# Patient Record
Sex: Female | Born: 1995 | Race: Black or African American | Hispanic: No | Marital: Single | Smoking: Never smoker
Health system: Southern US, Community
[De-identification: ages and names within clinical notes are randomized; demographics above are authoritative.]

## PROBLEM LIST (undated history)

## (undated) ENCOUNTER — Inpatient Hospital Stay (HOSPITAL_COMMUNITY): Payer: Self-pay

## (undated) DIAGNOSIS — G43909 Migraine, unspecified, not intractable, without status migrainosus: Secondary | ICD-10-CM

## (undated) HISTORY — PX: NO PAST SURGERIES: SHX2092

---
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
## 2017-02-15 ENCOUNTER — Emergency Department (HOSPITAL_COMMUNITY): Payer: BC Managed Care – PPO

## 2017-02-15 ENCOUNTER — Encounter (HOSPITAL_COMMUNITY): Payer: Self-pay | Admitting: Emergency Medicine

## 2017-02-15 ENCOUNTER — Emergency Department (HOSPITAL_COMMUNITY)
Admission: EM | Admit: 2017-02-15 | Discharge: 2017-02-15 | Disposition: A | Discharge status: Home / Self Care | Payer: BC Managed Care – PPO | Attending: Emergency Medicine | Admitting: Emergency Medicine

## 2017-02-15 DIAGNOSIS — R1030 Lower abdominal pain, unspecified: Secondary | ICD-10-CM

## 2017-02-15 DIAGNOSIS — Z113 Encounter for screening for infections with a predominantly sexual mode of transmission: Secondary | ICD-10-CM

## 2017-02-15 DIAGNOSIS — N898 Other specified noninflammatory disorders of vagina: Secondary | ICD-10-CM | POA: Insufficient documentation

## 2017-02-15 DIAGNOSIS — R102 Pelvic and perineal pain: Secondary | ICD-10-CM | POA: Diagnosis not present

## 2017-02-15 DIAGNOSIS — Z202 Contact with and (suspected) exposure to infections with a predominantly sexual mode of transmission: Secondary | ICD-10-CM | POA: Diagnosis not present

## 2017-02-15 LAB — URINALYSIS, ROUTINE W REFLEX MICROSCOPIC
BILIRUBIN URINE: NEGATIVE
Glucose, UA: NEGATIVE mg/dL
Hgb urine dipstick: NEGATIVE
Ketones, ur: 5 mg/dL — AB
Nitrite: NEGATIVE
PH: 7 (ref 5.0–8.0)
Protein, ur: NEGATIVE mg/dL
Specific Gravity, Urine: 1.015 (ref 1.005–1.030)

## 2017-02-15 LAB — POC URINE PREG, ED: Preg Test, Ur: NEGATIVE

## 2017-02-15 LAB — WET PREP, GENITAL
Clue Cells Wet Prep HPF POC: NONE SEEN
SPERM: NONE SEEN
TRICH WET PREP: NONE SEEN
YEAST WET PREP: NONE SEEN

## 2017-02-15 MED ORDER — CEFTRIAXONE SODIUM 250 MG IJ SOLR
250.0000 mg | Freq: Once | INTRAMUSCULAR | Status: AC
  Administered 2017-02-15: 250 mg via INTRAMUSCULAR
  Filled 2017-02-15: qty 250

## 2017-02-15 MED ORDER — ACETAMINOPHEN 325 MG PO TABS
650.0000 mg | ORAL_TABLET | Freq: Once | ORAL | Status: AC
  Administered 2017-02-15: 650 mg via ORAL
  Filled 2017-02-15: qty 2

## 2017-02-15 MED ORDER — SODIUM CHLORIDE 0.9 % IJ SOLN
INTRAMUSCULAR | Status: AC
  Filled 2017-02-15: qty 10

## 2017-02-15 MED ORDER — AZITHROMYCIN 250 MG PO TABS
1000.0000 mg | ORAL_TABLET | Freq: Once | ORAL | Status: AC
  Administered 2017-02-15: 1000 mg via ORAL
  Filled 2017-02-15: qty 4

## 2017-02-15 NOTE — Discharge Instructions (Signed)
You were tested for gonorrhea, chlamydia, HIV and syphilis today. The results will come back in 3 days. You will receive a phone call if they're positive. If Gonorrhea or Chlamydia are positive, you do not need further treatment, but you should contact your sexual partners so they can get treatment. If HIV or syphilis are positive, you need to follow up with the health department for treatment.  If the results are negative, you will not receive a phone call.  [facility]  Return to the ER if you develop fevers, chills, persistent vomiting, or any new or worsening symptoms

## 2017-02-15 NOTE — ED Triage Notes (Signed)
Pt states she has had recent irregular periods. Pt states she has had abnormal pelvic pain unrelated to menstrual cramps. Pt states she has had vaginal discharge and pain in pelvis with ambulation. Pt denies pain with urination.

## 2017-02-15 NOTE — ED Provider Notes (Signed)
[facility]-EMERGENCY DEPT Provider Note   CSN: [id number] Arrival date & time: 02/15/17  1056     History   Chief Complaint Chief Complaint  Patient presents with  . Pelvic Pain  . Vaginal Discharge    HPI Marissa Brennan is a 22 y.o. female presenting with 2 days lower abdominal pain.  Patient states that for the past several months, she's had very irregular cycles. She does not believe she had a period over the summer, but this month, had 2 periods. The first was light and only for a few days, the recent period was heavier. She used to be on depo shots, but last shot was in November 2017. She is not currently on any birth control. She reports associated vaginal discharge, beginning recently. She is sexually active with one female partner. They do not of protection. She is unsure if she is pregnant. She denies fever, chills, chest pain, shortness breath, nausea, vomiting, urinary symptoms, or abnormal bowel movements. The lower abdominal pain is described as cramping of the right lower quadrant/R side pelvis, worse when she moves. She has not tried anything to relieve her pain. She denies h/o of abd problems, but states that she was the the hospital in May and told that she has inflammation of her cervix, and she should not use tampons. Pt has had a normal appetite, and no increased pain with oral intake. No pain with urination or BMs.   HPI  Past Medical History:  Diagnosis Date  . Migraines     There are no active problems to display for this patient.   History reviewed. No pertinent surgical history.  OB History    No data available       Home Medications    Prior to Admission medications   Medication Sig Start Date End Date Taking? Authorizing Provider  ibuprofen (ADVIL,MOTRIN) 200 MG tablet Take 400 mg by mouth every 6 (six) hours as needed for moderate pain.   Yes [provider]    Family History History reviewed. No pertinent family history.  Social  History Social History  Substance Use Topics  . Smoking status: Never Smoker  . Smokeless tobacco: Never Used  . Alcohol use No     Allergies   Patient has no known allergies.   Review of Systems Review of Systems  Constitutional: Negative for chills and fever.  Respiratory: Negative for cough, chest tightness and shortness of breath.   Cardiovascular: Negative for chest pain.  Gastrointestinal: Positive for abdominal pain. Negative for abdominal distention, constipation, diarrhea, nausea and vomiting.  Genitourinary: Positive for menstrual problem, vaginal bleeding and vaginal discharge. Negative for dysuria, frequency and hematuria.     Physical Exam Updated Vital Signs BP 112/70 (BP Location: Left Arm)   Pulse 78   Temp 98.5 F (36.9 C) (Oral)   Resp 18   LMP 02/07/2017   SpO2 100%   Physical Exam  Constitutional: She is oriented to person, place, and time. She appears well-developed and well-nourished. No distress.  HENT:  Head: Normocephalic and atraumatic.  Eyes: EOM are normal.  Neck: Normal range of motion.  Cardiovascular: Normal rate, regular rhythm and intact distal pulses.   Pulmonary/Chest: Effort normal and breath sounds normal. No respiratory distress. She has no wheezes.  Abdominal: Soft. Bowel sounds are normal. She exhibits no distension and no mass. There is no tenderness. There is no guarding.  Genitourinary: Rectum normal, vagina normal and uterus normal. Pelvic exam was performed with patient supine. There is  no rash, tenderness or lesion on the right labia. There is no rash, tenderness or lesion on the left labia. Cervix exhibits discharge. Cervix exhibits no motion tenderness and no friability. Right adnexum displays no mass and no tenderness. Left adnexum displays no mass and no tenderness.  Genitourinary Comments: Chaperone present. Thick yellow discharge from the cervix. No cervical motion tenderness.  Musculoskeletal: Normal range of motion.   Neurological: She is alert and oriented to person, place, and time.  Skin: Skin is warm. No rash noted.  Psychiatric: She has a normal mood and affect.  Nursing note and vitals reviewed.    ED Treatments / Results  Labs (all labs ordered are listed, but only abnormal results are displayed) Labs Reviewed  WET PREP, GENITAL - Abnormal; Notable for the following:       Result Value   WBC, Wet Prep HPF POC MANY (*)    All other components within normal limits  URINALYSIS, ROUTINE W REFLEX MICROSCOPIC - Abnormal; Notable for the following:    APPearance HAZY (*)    Ketones, ur 5 (*)    Leukocytes, UA LARGE (*)    Bacteria, UA RARE (*)    Squamous Epithelial / LPF 6-30 (*)    All other components within normal limits  URINE CULTURE  RPR  HIV ANTIBODY (ROUTINE TESTING)  POC URINE PREG, ED  GC/CHLAMYDIA PROBE AMP ([place]    EKG  EKG Interpretation None       Radiology [place] Transvaginal Non-ob  Result Date: 02/15/2017 CLINICAL DATA:  Right pelvic pain. EXAM: TRANSABDOMINAL AND TRANSVAGINAL ULTRASOUND OF PELVIS TECHNIQUE: Both transabdominal and transvaginal ultrasound examinations of the pelvis were performed. Transabdominal technique was performed for global imaging of the pelvis including uterus, ovaries, adnexal regions, and pelvic cul-de-sac. It was necessary to proceed with endovaginal exam following the transabdominal exam to visualize the ovaries and uterus. COMPARISON:  None FINDINGS: Uterus Measurements: 6 x 3 x 4 cm. No fibroids or other mass visualized. Endometrium Thickness: 6 mm.  No focal abnormality visualized. Right ovary Measurements: 36 x 18 x 17 mm. Normal appearance/no adnexal mass. Left ovary Measurements: 23 x 10 x 13 mm. Normal appearance/no adnexal mass. Other findings No abnormal free fluid. IMPRESSION: Normal pelvic ultrasound. Electronically Signed   By: Marnee SpringJonathon  Watts M.D.   On: 02/15/2017 16:02   [place] Pelvis Complete  Result Date:  02/15/2017 CLINICAL DATA:  Right pelvic pain. EXAM: TRANSABDOMINAL AND TRANSVAGINAL ULTRASOUND OF PELVIS TECHNIQUE: Both transabdominal and transvaginal ultrasound examinations of the pelvis were performed. Transabdominal technique was performed for global imaging of the pelvis including uterus, ovaries, adnexal regions, and pelvic cul-de-sac. It was necessary to proceed with endovaginal exam following the transabdominal exam to visualize the ovaries and uterus. COMPARISON:  None FINDINGS: Uterus Measurements: 6 x 3 x 4 cm. No fibroids or other mass visualized. Endometrium Thickness: 6 mm.  No focal abnormality visualized. Right ovary Measurements: 36 x 18 x 17 mm. Normal appearance/no adnexal mass. Left ovary Measurements: 23 x 10 x 13 mm. Normal appearance/no adnexal mass. Other findings No abnormal free fluid. IMPRESSION: Normal pelvic ultrasound. Electronically Signed   By: Marnee SpringJonathon  Watts M.D.   On: 02/15/2017 16:02    Procedures Procedures (including critical care time)  Medications Ordered in ED Medications  sodium chloride 0.9 % injection (not administered)  cefTRIAXone (ROCEPHIN) injection 250 mg (250 mg Intramuscular Given 02/15/17 1448)  azithromycin (ZITHROMAX) tablet 1,000 mg (1,000 mg Oral Given 02/15/17 1448)  acetaminophen (  TYLENOL) tablet 650 mg (650 mg Oral Given 02/15/17 1448)     Initial Impression / Assessment and Plan / ED Course  I have reviewed the triage vital signs and the nursing notes.  Pertinent labs & imaging results that were available during my care of the patient were reviewed by me and considered in my medical decision making (see chart for details).     Pt presenting with irregular bleeding, lower abd cramping, and vaginal discharge. Physical exam shows no ttp of abd. Pelvic with thick yellow discharge. Discussed tx for Gc/Cl before results return. Discussed labs are pending, and pt will not need further tx if gc/cl is positive. Wet prep negative for trich, BV, and  yeast. UA negative for obvious UTI, culture pending. Pregnancy negative.  Will order pelvic US to r/o cyst of other GYN cause for pain.  Pelvic US negative. Based on physical exam, history and pt afebrile, bout appendicitis or intraabdomina infection, perforation, or obstruction. At this time, pt appears safe for discharge. Pt to f/u with OB/GYN. Return precautions given. Pt states she understands and agrees to plan.   Final Clinical Impressions(s) / ED Diagnoses   Final diagnoses:  Lower abdominal pain  Screen for STD (sexually transmitted disease)    New Prescriptions New Prescriptions   No medications on file     Alveria Apley, PA-C 02/15/17 1627    Pricilla Loveless, MD 02/19/17 [phone]

## 2017-02-16 LAB — HIV ANTIBODY (ROUTINE TESTING W REFLEX): HIV Screen 4th Generation wRfx: NONREACTIVE

## 2017-02-16 LAB — GC/CHLAMYDIA PROBE AMP (~~LOC~~) NOT AT ARMC
CHLAMYDIA, DNA PROBE: NEGATIVE
Neisseria Gonorrhea: NEGATIVE

## 2017-02-16 LAB — RPR: RPR Ser Ql: NONREACTIVE

## 2017-02-17 LAB — URINE CULTURE: CULTURE: NO GROWTH

## 2017-07-17 ENCOUNTER — Encounter (HOSPITAL_COMMUNITY): Payer: Self-pay

## 2017-07-17 ENCOUNTER — Other Ambulatory Visit: Payer: Self-pay

## 2017-07-17 ENCOUNTER — Emergency Department (HOSPITAL_COMMUNITY): Payer: BLUE CROSS/BLUE SHIELD

## 2017-07-17 ENCOUNTER — Emergency Department (HOSPITAL_COMMUNITY)
Admission: EM | Admit: 2017-07-17 | Discharge: 2017-07-17 | Disposition: A | Discharge status: Home / Self Care | Payer: BLUE CROSS/BLUE SHIELD | Attending: Emergency Medicine | Admitting: Emergency Medicine

## 2017-07-17 ENCOUNTER — Other Ambulatory Visit (HOSPITAL_COMMUNITY): Payer: BC Managed Care – PPO

## 2017-07-17 DIAGNOSIS — O9989 Other specified diseases and conditions complicating pregnancy, childbirth and the puerperium: Secondary | ICD-10-CM | POA: Diagnosis present

## 2017-07-17 DIAGNOSIS — Z3A01 Less than 8 weeks gestation of pregnancy: Secondary | ICD-10-CM

## 2017-07-17 DIAGNOSIS — R1032 Left lower quadrant pain: Secondary | ICD-10-CM | POA: Diagnosis not present

## 2017-07-17 DIAGNOSIS — R42 Dizziness and giddiness: Secondary | ICD-10-CM | POA: Insufficient documentation

## 2017-07-17 DIAGNOSIS — R1031 Right lower quadrant pain: Secondary | ICD-10-CM

## 2017-07-17 LAB — WET PREP, GENITAL
Sperm: NONE SEEN
TRICH WET PREP: NONE SEEN
YEAST WET PREP: NONE SEEN

## 2017-07-17 LAB — COMPREHENSIVE METABOLIC PANEL WITH GFR
ALT: 10 U/L — ABNORMAL LOW (ref 14–54)
AST: 19 U/L (ref 15–41)
Albumin: 4.1 g/dL (ref 3.5–5.0)
Alkaline Phosphatase: 88 U/L (ref 38–126)
Anion gap: 12 (ref 5–15)
BUN: <5 mg/dL — ABNORMAL LOW (ref 6–20)
CO2: 22 mmol/L (ref 22–32)
Calcium: 9.3 mg/dL (ref 8.9–10.3)
Chloride: 102 mmol/L (ref 101–111)
Creatinine, Ser: 0.63 mg/dL (ref 0.44–1.00)
GFR calc Af Amer: >60 mL/min
GFR calc non Af Amer: >60 mL/min
Glucose, Bld: 82 mg/dL (ref 65–99)
Potassium: 4 mmol/L (ref 3.5–5.1)
Sodium: 136 mmol/L (ref 135–145)
Total Bilirubin: 0.9 mg/dL (ref 0.3–1.2)
Total Protein: 7.3 g/dL (ref 6.5–8.1)

## 2017-07-17 LAB — CBC WITH DIFFERENTIAL/PLATELET
Basophils Absolute: 0 K/uL (ref 0.0–0.1)
Basophils Relative: 0 %
Eosinophils Absolute: 0 K/uL (ref 0.0–0.7)
Eosinophils Relative: 0 %
HCT: 37.2 % (ref 36.0–46.0)
Hemoglobin: 12.2 g/dL (ref 12.0–15.0)
Lymphocytes Relative: 21 %
Lymphs Abs: 1.9 K/uL (ref 0.7–4.0)
MCH: 29 pg (ref 26.0–34.0)
MCHC: 32.8 g/dL (ref 30.0–36.0)
MCV: 88.6 fL (ref 78.0–100.0)
Monocytes Absolute: 0.7 K/uL (ref 0.1–1.0)
Monocytes Relative: 7 %
Neutro Abs: 6.8 K/uL (ref 1.7–7.7)
Neutrophils Relative %: 72 %
Platelets: 222 K/uL (ref 150–400)
RBC: 4.2 MIL/uL (ref 3.87–5.11)
RDW: 12.3 % (ref 11.5–15.5)
WBC: 9.4 K/uL (ref 4.0–10.5)

## 2017-07-17 LAB — URINALYSIS, ROUTINE W REFLEX MICROSCOPIC
Bilirubin Urine: NEGATIVE
Glucose, UA: NEGATIVE mg/dL
Hgb urine dipstick: NEGATIVE
Ketones, ur: 80 mg/dL — AB
Leukocytes, UA: NEGATIVE
Nitrite: NEGATIVE
Protein, ur: NEGATIVE mg/dL
Specific Gravity, Urine: 1.011 (ref 1.005–1.030)
pH: 6 (ref 5.0–8.0)

## 2017-07-17 LAB — HCG, QUANTITATIVE, PREGNANCY: hCG, Beta Chain, Quant, S: 36341 m[IU]/mL — ABNORMAL HIGH

## 2017-07-17 MED ORDER — ACETAMINOPHEN 500 MG PO TABS
500.0000 mg | ORAL_TABLET | Freq: Once | ORAL | Status: AC
  Administered 2017-07-17: 500 mg via ORAL
  Filled 2017-07-17: qty 1

## 2017-07-17 NOTE — ED Notes (Signed)
Patient transported to Ultrasound 

## 2017-07-17 NOTE — ED Triage Notes (Signed)
Pt states she is [redacted] weeks pregnant with abdominal cramping for 2 weeks. States her sister had ectopic pregnancy and she is worried about that. Pt denies discharge or bleeding. Vitals stable. Pt also reports intermittent lightheadedness.

## 2017-07-17 NOTE — ED Provider Notes (Signed)
Patient placed in Quick Look pathway, seen and evaluated   Chief Complaint: abdominal cramping  HPI:   22 y.o [redacted] weeks pregnant presented to the ED complaining of intermittent lower abdominal cramping for 2 weeks. This is pts first pregnancy. No vaginal bleeding. Recently treated for UTi 2 weeks ago. No fevers. N vaginal discharge. Pt is sexually active but denies concern for STD.  ROS: + ABDOMINAL CRAMPING  Negative for dysuria, fevers, vaginal dischrge, vaginal bleeding, (one)  Physical Exam:   Gen: No distress  Neuro: Awake and Alert  Skin: Warm    Focused Exam: Abd: lower abd TTP with deep palpation without rebound or guarding  Will order pelvic US, UA, hcg quant, CBC, BMP   Initiation of care has begun. The patient has been counseled on the process, plan, and necessity for staying for the completion/evaluation, and the remainder of the medical screening examination    Dub MikesDowless, Reyes Aldaco Tripp, PA-C 07/17/17 1453    Pricilla LovelessGoldston, Scott, MD 07/17/17 2220

## 2017-07-17 NOTE — Discharge Instructions (Signed)
You were seen here today for lower abdominal pain/cramping. Your lab work and ultrasound was reassuring. Your ultrasound showed intrauterine gestation not demonstrates a fetal pole with cardiac activity of 104 bpm.  Was noted to be a cyst of your right ovary.  Imaging was obtained to rule out ovarian torsion that was reassuring. I would like you to establish care with OB for follow-up.  Please call women's and schedule an appointment.  Will need follow-up ultrasounds (10-14 days) and repeat hCG testing.  Please take Tylenol as needed for pain.  Avoid NSAIDs such as ibuprofen. If you develop worsening or new concerning symptoms you can return to the emergency department for re-evaluation.

## 2017-07-17 NOTE — ED Provider Notes (Signed)
[facility] EMERGENCY DEPARTMENT Provider Note   CSN: [id number] Arrival date & time: 07/17/17  1428     History   Chief Complaint Chief Complaint  Patient presents with  . Abdominal Pain    HPI Marissa Brennan is a 22 y.o. female who is G1P0, reportedly [redacted] weeks pregnant presenting to the emergency department for lower abdominal cramping times 2 weeks.  Patient notes that she has had intermittent episodes of lower abdominal cramping that occurs sporadically and last for approximately 15 minutes over the last 2 weeks.  She was seen at the health department for this on Monday and told that she had a UTI and was started on Keflex.  She has been taking this as prescribed.  She is not taking anything for her pain.  Patient denies any associated fever, nausea/vomiting/diarrhea, constipation, urinary frequency, urinary urgency, dysuria, hematuria, vaginal discharge or vaginal bleeding.  Patient is sexually active but denies concerns for STI's. No prior history of STI's. No previous abdominal surgeries.  Last normal bowel movement today.  HPI  Past Medical History:  Diagnosis Date  . Migraines     There are no active problems to display for this patient.   History reviewed. No pertinent surgical history.  OB History    Gravida Para Term Preterm AB Living   1             SAB TAB Ectopic Multiple Live Births                   Home Medications    Prior to Admission medications   Medication Sig Start Date End Date Taking? Authorizing Provider  ibuprofen (ADVIL,MOTRIN) 200 MG tablet Take 400 mg by mouth every 6 (six) hours as needed for moderate pain.    [provider]    Family History History reviewed. No pertinent family history.  Social History Social History   Tobacco Use  . Smoking status: Never Smoker  . Smokeless tobacco: Never Used  Substance Use Topics  . Alcohol use: No  . Drug use: No     Allergies   Patient has no known  allergies.   Review of Systems Review of Systems  All other systems reviewed and are negative.    Physical Exam Updated Vital Signs BP 109/61 (BP Location: Left Arm)   Pulse 73   Temp 98.5 F (36.9 C) (Oral)   Resp 16   LMP 06/03/2017 (Exact Date)   SpO2 100%   Physical Exam  Constitutional: She appears well-developed and well-nourished.  HENT:  Head: Normocephalic and atraumatic.  Right Ear: External ear normal.  Left Ear: External ear normal.  Nose: Nose normal.  Mouth/Throat: Uvula is midline, oropharynx is clear and moist and mucous membranes are normal. No tonsillar exudate.  Eyes: Pupils are equal, round, and reactive to light. Right eye exhibits no discharge. Left eye exhibits no discharge. No scleral icterus.  Neck: Trachea normal. Neck supple. No spinous process tenderness present. No neck rigidity. Normal range of motion present.  Cardiovascular: Normal rate, regular rhythm and intact distal pulses.  No murmur heard. Pulses:      Radial pulses are 2+ on the right side, and 2+ on the left side.       Dorsalis pedis pulses are 2+ on the right side, and 2+ on the left side.       Posterior tibial pulses are 2+ on the right side, and 2+ on the left side.  No lower extremity  swelling or edema. Calves symmetric in size bilaterally.  Pulmonary/Chest: Effort normal and breath sounds normal. She exhibits no tenderness.  Abdominal: Soft. Bowel sounds are normal. There is no tenderness. There is no rigidity, no rebound, no guarding and no CVA tenderness.  Minimally gravid abdomen  Genitourinary:  Genitourinary Comments: Exam performed by Jacinto Halim, exam chaperoned Pelvic exam: normal external genitalia without evidence of trauma. VULVA: normal appearing vulva with no masses, tenderness or lesion. VAGINA: normal appearing vagina with normal color and discharge, no lesions. CERVIX: normal appearing cervix without lesions, cervical motion tenderness absent, cervical os  closed with out purulent discharge or bleeding;  Wet prep and DNA probe for chlamydia and GC obtained.   ADNEXA: normal adnexa in size. No left sided adnexal TTP. There is minimal TTP of the right adnexa. No masses UTERUS: uterus is normal size, shape, consistency and nontender.   Musculoskeletal: She exhibits no edema.  Lymphadenopathy:    She has no cervical adenopathy.  Neurological: She is alert.  Skin: Skin is warm and dry. No rash noted. She is not diaphoretic.  Psychiatric: She has a normal mood and affect.  Nursing note and vitals reviewed.    ED Treatments / Results  Labs (all labs ordered are listed, but only abnormal results are displayed) Labs Reviewed  URINALYSIS, ROUTINE W REFLEX MICROSCOPIC - Abnormal; Notable for the following components:      Result Value   Ketones, ur 80 (*)    All other components within normal limits  HCG, QUANTITATIVE, PREGNANCY - Abnormal; Notable for the following components:   hCG, Beta Chain, Quant, S 36,341 (*)    All other components within normal limits  COMPREHENSIVE METABOLIC PANEL - Abnormal; Notable for the following components:   BUN <5 (*)    ALT 10 (*)    All other components within normal limits  CBC WITH DIFFERENTIAL/PLATELET    EKG  EKG Interpretation None       Radiology US Ob Comp Less 14 Wks  Result Date: 07/17/2017 CLINICAL DATA:  Abdominal cramping for 2 weeks, positive pregnancy EXAM: OBSTETRIC <14 WK [place] AND TRANSVAGINAL OB US TECHNIQUE: Both transabdominal and transvaginal ultrasound examinations were performed for complete evaluation of the gestation as well as the maternal uterus, adnexal regions, and pelvic cul-de-sac. Transvaginal technique was performed to assess early pregnancy. COMPARISON:  None. FINDINGS: Intrauterine gestational sac: Visible Yolk sac:  Visible Embryo:  Not visible MSD: 16 mm   6 w   3 d Subchorionic hemorrhage:  None visualized. Maternal uterus/adnexae: Left ovary is within normal limits  and measures 3.1 by 1.1 x 1.4 cm. The right ovary measures 2.4 by 3.2 x 2.4 cm. Isodose to mildly hypoechoic hypovascular area in the right ovary measuring 2.2 x 2 x 2.1 cm. Trace free fluid. IMPRESSION: 1. Single intrauterine pregnancy with visible gestational sac and yolk sac but no embryo. Consider 10-14 days follow-up sonography to confirm viability. 2. Iso to mildly hypoechoic nodule in the right ovary measuring 2.2 cm, questionable for hemorrhagic or complicated cyst. Suggest a follow-up sonogram in 6 weeks to re-evaluate; alternatively, if patient returns in 10-14 days to confirm viability, this finding could also be re-evaluated at that time. 3. Trace free fluid in the pelvis Electronically Signed   By: Jasmine Pang M.D.   On: 07/17/2017 18:28   US Ob Transvaginal  Result Date: 07/17/2017 CLINICAL DATA:  Abdominal cramping for 2 weeks, positive pregnancy EXAM: OBSTETRIC <14 WK [place] AND TRANSVAGINAL OB US  TECHNIQUE: Both transabdominal and transvaginal ultrasound examinations were performed for complete evaluation of the gestation as well as the maternal uterus, adnexal regions, and pelvic cul-de-sac. Transvaginal technique was performed to assess early pregnancy. COMPARISON:  None. FINDINGS: Intrauterine gestational sac: Visible Yolk sac:  Visible Embryo:  Not visible MSD: 16 mm   6 w   3 d Subchorionic hemorrhage:  None visualized. Maternal uterus/adnexae: Left ovary is within normal limits and measures 3.1 by 1.1 x 1.4 cm. The right ovary measures 2.4 by 3.2 x 2.4 cm. Isodose to mildly hypoechoic hypovascular area in the right ovary measuring 2.2 x 2 x 2.1 cm. Trace free fluid. IMPRESSION: 1. Single intrauterine pregnancy with visible gestational sac and yolk sac but no embryo. Consider 10-14 days follow-up sonography to confirm viability. 2. Iso to mildly hypoechoic nodule in the right ovary measuring 2.2 cm, questionable for hemorrhagic or complicated cyst. Suggest a follow-up sonogram in 6 weeks to  re-evaluate; alternatively, if patient returns in 10-14 days to confirm viability, this finding could also be re-evaluated at that time. 3. Trace free fluid in the pelvis Electronically Signed   By: Jasmine Pang M.D.   On: 07/17/2017 18:28   [place] Art/ven Flow Abd Pelv Doppler  Result Date: 07/17/2017 CLINICAL DATA:  Right lower quadrant pelvic pain for 2 weeks EXAM: DOPPLER ULTRASOUND OF OVARIES TECHNIQUE: Color and duplex Doppler ultrasound was utilized to evaluate blood flow to the ovaries. COMPARISON:  07/17/2017 Ob ultrasound FINDINGS: Pulsed Doppler evaluation of both ovaries demonstrates normal low-resistance arterial and venous waveforms. A hypovascular lesion is again visible in the right ovary. There is small free fluid in the pelvis and right adnexa. Sonographer repeated images of the intrauterine gestation. She reports a change in uterus orientation during scanning, allowing better visualization of the intrauterine gestation. Intrauterine gestational sac and yolk sac are visible. A fetal pole is visible with crown-rump length of 2.3 mm, corresponding to 5 weeks 6 days and EDC of 03/13/2018. Fetal cardiac activity of 104 beats per minute. IMPRESSION: 1. Negative for ovarian torsion. Small amount of pelvic free fluid and right adnexal fluid 2. Isoechoic area in the right ovary without significant internal flow, possible hemorrhagic luteal cyst, consider short interval ultrasound follow-up for this finding 3. Repeat imaging of the intrauterine gestation now demonstrates a fetal pole with cardiac activity of 104 beats per minute. This is likely due to change in position of uterus with better visibility of the intrauterine gestation. Electronically Signed   By: Jasmine Pang M.D.   On: 07/17/2017 22:58    Procedures Procedures (including critical care time)  Medications Ordered in ED Medications  acetaminophen (TYLENOL) tablet 500 mg (not administered)     Initial Impression / Assessment and Plan  / ED Course  I have reviewed the triage vital signs and the nursing notes.  Pertinent labs & imaging results that were available during my care of the patient were reviewed by me and considered in my medical decision making (see chart for details).      22 y.o. G1P0 female, currently [redacted] week pregnant presenting for lower abdominal cramping x 2 weeks. Patient is pain free currently. Being [facility] department [facility] school for UTI with keflex since Monday. Urinary symptoms have resolved since that time. She has not established care with an OB. Denies vaginal bleeding, vaginal discharge or urinary symptoms. Patient abdominal exam without tenderness to palpation. Vital signs are reassuring. No fever, tachycardia or HTN. Patient urinalysis without evidence of UTI.  Beta hCG system in the range of 6-week pregnancy.  No leukocytosis.  No anemia.  No electrolyte abnormalities.  Normal kidney function values.  Ultrasound with single intrauterine pregnancy with visible gestational sac and yolk sac.  There is also noted to be a hypoechoic nodule of the right ovary with question of hemorrhagic or complicated cyst. Pelvic exam with right adnexal TTP. Spoke with radiologist in regards to this who recommended getting doppler of right ovary to ensure good blood flow to this area and rule out torsion. No concern for PID as patient without fever, vaginal d/c or CMT.   Doppler ultrasound negative for ovarian torsion.  Repeat imaging of intrauterine gestation not demonstrates a fetal pole with cardiac activity of 104 bpm which is reassuring.  Patient is nontender to palpation on abdominal exam, with reassuring ultrasound lab values and vital signs do not suspect ectopic pregnancy, appendicitis or other intra-abdominal pathology at this time.  The evaluation does not show pathology that would require ongoing emergent intervention or inpatient treatment. I advised the patient to follow-up with OBGYN this week.  Will  provide referral to women's.  Advise repeat ultrasound and serial hCG for follow-up.  I advised the patient to return to the emergency department with new or worsening symptoms or new concerns. Specific return precautions discussed. The patient verbalized understanding and agreement with plan. All questions answered. No further questions at this time. The patient is hemodynamically stable, mentating appropriately and appears safe for discharge.  Final Clinical Impressions(s) / ED Diagnoses   Final diagnoses:  Less than [redacted] weeks gestation of pregnancy  Abdominal cramping, bilateral lower quadrant    ED Discharge Orders    None       Princella PellegriniMaczis, Enid Maultsby M, PA-C 07/17/17 2319    Arby BarrettePfeiffer, Marcy, MD 07/19/17 [phone]

## 2017-07-17 NOTE — ED Notes (Signed)
EDP at bedside. Sheria Langameron, NT assisting with pelvic exam.

## 2017-07-20 LAB — GC/CHLAMYDIA PROBE AMP (~~LOC~~) NOT AT ARMC
Chlamydia: NEGATIVE
Neisseria Gonorrhea: NEGATIVE

## 2017-07-21 ENCOUNTER — Other Ambulatory Visit: Payer: Self-pay | Admitting: General Practice

## 2017-07-21 DIAGNOSIS — O3680X1 Pregnancy with inconclusive fetal viability, fetus 1: Secondary | ICD-10-CM

## 2017-07-23 ENCOUNTER — Other Ambulatory Visit: Payer: BC Managed Care – PPO

## 2017-07-27 ENCOUNTER — Ambulatory Visit (HOSPITAL_COMMUNITY): Payer: BC Managed Care – PPO

## 2017-07-29 ENCOUNTER — Ambulatory Visit: Payer: Medicaid - Out of State | Admitting: *Deleted

## 2017-07-29 ENCOUNTER — Ambulatory Visit (HOSPITAL_COMMUNITY)
Admission: RE | Admit: 2017-07-29 | Discharge: 2017-07-29 | Disposition: A | Discharge status: Home / Self Care | Payer: BC Managed Care – PPO | Source: Ambulatory Visit | Attending: Obstetrics & Gynecology | Admitting: Obstetrics & Gynecology

## 2017-07-29 DIAGNOSIS — O3680X1 Pregnancy with inconclusive fetal viability, fetus 1: Secondary | ICD-10-CM

## 2017-07-29 DIAGNOSIS — Z3490 Encounter for supervision of normal pregnancy, unspecified, unspecified trimester: Secondary | ICD-10-CM

## 2017-07-29 DIAGNOSIS — Z3A01 Less than 8 weeks gestation of pregnancy: Secondary | ICD-10-CM | POA: Insufficient documentation

## 2017-07-29 DIAGNOSIS — O208 Other hemorrhage in early pregnancy: Secondary | ICD-10-CM | POA: Insufficient documentation

## 2017-07-29 NOTE — Progress Notes (Signed)
Chart reviewed for nurse visit. Agree with plan of care.   Pincus Largehelps, Ezri Landers Y, DO 07/29/2017 3:15 PM

## 2017-07-29 NOTE — Progress Notes (Signed)
Here for US results. Reviewed with Caryl AdaJazma Phelps, DO and informed patient US shows live baby 7wk 4d and we recommend she start prenatal care. She voices understanding.

## 2017-08-06 ENCOUNTER — Encounter (HOSPITAL_COMMUNITY): Payer: Self-pay | Admitting: *Deleted

## 2017-08-06 ENCOUNTER — Inpatient Hospital Stay (HOSPITAL_COMMUNITY)
Admission: AD | Admit: 2017-08-06 | Discharge: 2017-08-06 | Disposition: A | Discharge status: Home / Self Care | Payer: BLUE CROSS/BLUE SHIELD | Source: Ambulatory Visit | Attending: Obstetrics & Gynecology | Admitting: Obstetrics & Gynecology

## 2017-08-06 DIAGNOSIS — N898 Other specified noninflammatory disorders of vagina: Secondary | ICD-10-CM | POA: Diagnosis present

## 2017-08-06 DIAGNOSIS — O26891 Other specified pregnancy related conditions, first trimester: Secondary | ICD-10-CM | POA: Diagnosis not present

## 2017-08-06 DIAGNOSIS — Z3A09 9 weeks gestation of pregnancy: Secondary | ICD-10-CM | POA: Diagnosis not present

## 2017-08-06 DIAGNOSIS — K5900 Constipation, unspecified: Secondary | ICD-10-CM | POA: Diagnosis not present

## 2017-08-06 LAB — CBC
HCT: 32.8 % — ABNORMAL LOW (ref 36.0–46.0)
Hemoglobin: 11.1 g/dL — ABNORMAL LOW (ref 12.0–15.0)
MCH: 29.3 pg (ref 26.0–34.0)
MCHC: 33.8 g/dL (ref 30.0–36.0)
MCV: 86.5 fL (ref 78.0–100.0)
Platelets: 196 10*3/uL (ref 150–400)
RBC: 3.79 MIL/uL — AB (ref 3.87–5.11)
RDW: 12.2 % (ref 11.5–15.5)
WBC: 8.5 10*3/uL (ref 4.0–10.5)

## 2017-08-06 LAB — URINALYSIS, ROUTINE W REFLEX MICROSCOPIC
BILIRUBIN URINE: NEGATIVE
GLUCOSE, UA: NEGATIVE mg/dL
Hgb urine dipstick: NEGATIVE
KETONES UR: NEGATIVE mg/dL
Leukocytes, UA: NEGATIVE
Nitrite: NEGATIVE
PH: 7 (ref 5.0–8.0)
PROTEIN: NEGATIVE mg/dL
Specific Gravity, Urine: 1.01 (ref 1.005–1.030)

## 2017-08-06 LAB — WET PREP, GENITAL
CLUE CELLS WET PREP: NONE SEEN
Sperm: NONE SEEN
TRICH WET PREP: NONE SEEN
YEAST WET PREP: NONE SEEN

## 2017-08-06 LAB — ABO/RH: ABO/RH(D): O POS

## 2017-08-06 MED ORDER — POLYETHYLENE GLYCOL 3350 17 GM/SCOOP PO POWD: 17.0000 g | Freq: Every day | ORAL | 0 refills | Status: AC

## 2017-08-06 NOTE — Discharge Instructions (Signed)
Abdominal Pain During Pregnancy Belly (abdominal) pain is common during pregnancy. Most of the time, it is not a serious problem. Other times, it can be a sign that something is wrong with the pregnancy. Always tell your doctor if you have belly pain. Follow these instructions at home: Monitor your belly pain for any changes. The following actions may help you feel better:  Do not have sex (intercourse) or put anything in your vagina until you feel better.  Rest until your pain stops.  Drink clear fluids if you feel sick to your stomach (nauseous). Do not eat solid food until you feel better.  Only take medicine as told by your doctor.  Keep all doctor visits as told.  Get help right away if:  You are bleeding, leaking fluid, or pieces of tissue come out of your vagina.  You have more pain or cramping.  You keep throwing up (vomiting).  You have pain when you pee (urinate) or have blood in your pee.  You have a fever.  You do not feel your baby moving as much.  You feel very weak or feel like passing out.  You have trouble breathing, with or without belly pain.  You have a very bad headache and belly pain.  You have fluid leaking from your vagina and belly pain.  You keep having watery poop (diarrhea).  Your belly pain does not go away after resting, or the pain gets worse. This information is not intended to replace advice given to you by your health care provider. Make sure you discuss any questions you have with your health care provider. Document Released: 05/14/2009 Document Revised: 01/02/2016 Document Reviewed: 12/23/2012 Elsevier Interactive Patient Education  2018 [employer] Constipation, Adult Constipation is when a person:  Poops (has a bowel movement) fewer times in a week than normal.  Has a hard time pooping.  Has poop that is dry, hard, or bigger than normal.  Follow these instructions at home: Eating and drinking   Eat foods that have a lot  of fiber, such as: ? Fresh fruits and vegetables. ? Whole grains. ? Beans.  Eat less of foods that are high in fat, low in fiber, or overly processed, such as: ? [place] fries. ? Hamburgers. ? Cookies. ? Candy. ? Soda.  Drink enough fluid to keep your pee (urine) clear or pale yellow. General instructions  Exercise regularly or as told by your doctor.  Go to the restroom when you feel like you need to poop. Do not hold it in.  Take over-the-counter and prescription medicines only as told by your doctor. These include any fiber supplements.  Do pelvic floor retraining exercises, such as: ? Doing deep breathing while relaxing your lower belly (abdomen). ? Relaxing your pelvic floor while pooping.  Watch your condition for any changes.  Keep all follow-up visits as told by your doctor. This is important. Contact a doctor if:  You have pain that gets worse.  You have a fever.  You have not pooped for 4 days.  You throw up (vomit).  You are not hungry.  You lose weight.  You are bleeding from the anus.  You have thin, pencil-like poop (stool). Get help right away if:  You have a fever, and your symptoms suddenly get worse.  You leak poop or have blood in your poop.  Your belly feels hard or bigger than normal (is bloated).  You have very bad belly pain.  You feel dizzy or you faint.  This information is not intended to replace advice given to you by your health care provider. Make sure you discuss any questions you have with your health care provider. Document Released: 11/12/2007 Document Revised: 12/14/2015 Document Reviewed: 11/14/2015 Elsevier Interactive Patient Education  2018 [employer].

## 2017-08-06 NOTE — MAU Note (Signed)
Pt presents with c/o lower abdominal cramping and brown vaginal discharge that began yesterday.  Pt instructed by HD to be evaluated @ MAU.  Pt has appt @ HD on Monday, August 10, 2017.

## 2017-08-06 NOTE — MAU Provider Note (Addendum)
History     CSN: [id number]  Arrival date and time: 08/06/17 1022   First Provider Initiated Contact with Patient 08/06/17 1112      Chief Complaint  Patient presents with  . Headache  . Vaginal Discharge   G1 @9 .0 wks here with LAP and vaginal discharge. LAP is ongoing for 3 weeks. Describes as sharp, intermittent, and bilateral lower abdomen. Denies urinary sx except has to push to start stream at times. Discharge happened this am and describes as thin and brown. No itching or odor. No recent IC. Reports constipation and hard stools. Last BM yesterday.    OB History    Gravida Para Term Preterm AB Living   1             SAB TAB Ectopic Multiple Live Births         0        Past Medical History:  Diagnosis Date  . Migraines     Past Surgical History:  Procedure Laterality Date  . NO PAST SURGERIES      Family History  Problem Relation Age of Onset  . Diabetes Mother   . Cancer Maternal Grandmother   . Hypertension Maternal Grandmother     Social History   Tobacco Use  . Smoking status: Never Smoker  . Smokeless tobacco: Never Used  Substance Use Topics  . Alcohol use: No    Comment: social  . Drug use: No    Allergies: No Known Allergies  Medications Prior to Admission  Medication Sig Dispense Refill Last Dose  . acetaminophen (TYLENOL) 325 MG tablet Take 650 mg by mouth every 6 (six) hours as needed for mild pain or headache.   Past Week at Unknown time  . Prenatal Vit-Fe Fumarate-FA (PRENATAL MULTIVITAMIN) TABS tablet Take 1 tablet by mouth daily at 12 noon.   08/06/2017 at Unknown time    Review of Systems  Gastrointestinal: Positive for abdominal pain and constipation. Negative for nausea and vomiting.  Genitourinary: Positive for difficulty urinating and vaginal discharge. Negative for dysuria, frequency, hematuria and vaginal bleeding.   Physical Exam   Blood pressure 110/67, pulse 73, temperature 98.9 F (37.2 C), temperature source Oral,  resp. rate 18, height 5\' 4"  (1.626 m), weight 127 lb (57.6 kg), last menstrual period 06/03/2017, unknown if currently breastfeeding.  Physical Exam  Nursing note and vitals reviewed. Constitutional: She is oriented to person, place, and time. She appears well-developed and well-nourished. No distress.  HENT:  Head: Normocephalic and atraumatic.  Neck: Normal range of motion.  Cardiovascular: Normal rate.  Respiratory: Effort normal. No respiratory distress.  GI: Soft. She exhibits no distension and no mass. There is no tenderness. There is no rebound and no guarding.  Genitourinary:  Genitourinary Comments: External: no lesions or erythema Vagina: rugated, pink, moist, moderate white discharge, no blood Cervix closed/long   Neurological: She is alert and oriented to person, place, and time.  Skin: Skin is warm and dry.  Psychiatric: She has a normal mood and affect.   Results for orders placed or performed during the hospital encounter of 08/06/17 (from the past 24 hour(s))  ABO/Rh     Status: None (Preliminary result)   Collection Time: 08/06/17 10:55 AM  Result Value Ref Range   ABO/RH(D)      O POS Performed at [facility], [address] [place], [place] [address]   CBC     Status: Abnormal   Collection Time: 08/06/17 10:55 AM  Result  Value Ref Range   WBC 8.5 4.0 - 10.5 K/uL   RBC 3.79 (L) 3.87 - 5.11 MIL/uL   Hemoglobin 11.1 (L) 12.0 - 15.0 g/dL   HCT 16.1 (L) 09.6 - 04.5 %   MCV 86.5 78.0 - 100.0 fL   MCH 29.3 26.0 - 34.0 pg   MCHC 33.8 30.0 - 36.0 g/dL   RDW 40.9 81.1 - 91.4 %   Platelets 196 150 - 400 K/uL  Urinalysis, Routine w reflex microscopic     Status: None   Collection Time: 08/06/17 11:21 AM  Result Value Ref Range   Color, Urine YELLOW YELLOW   APPearance CLEAR CLEAR   Specific Gravity, Urine 1.010 1.005 - 1.030   pH 7.0 5.0 - 8.0   Glucose, UA NEGATIVE NEGATIVE mg/dL   Hgb urine dipstick NEGATIVE NEGATIVE   Bilirubin Urine NEGATIVE NEGATIVE    Ketones, ur NEGATIVE NEGATIVE mg/dL   Protein, ur NEGATIVE NEGATIVE mg/dL   Nitrite NEGATIVE NEGATIVE   Leukocytes, UA NEGATIVE NEGATIVE  Wet prep, genital     Status: Abnormal   Collection Time: 08/06/17 11:21 AM  Result Value Ref Range   Yeast Wet Prep HPF POC NONE SEEN NONE SEEN   Trich, Wet Prep NONE SEEN NONE SEEN   Clue Cells Wet Prep HPF POC NONE SEEN NONE SEEN   WBC, Wet Prep HPF POC FEW (A) NONE SEEN   Sperm NONE SEEN    Limited bedside US: viable, active fetus, +cardiac activity, subj. nml AFV  MAU Course  Procedures  MDM Labs ordered and reviewed. No evidence of UTI, vaginal infection, or impending SAB. Pain likely physiologic to early pregnancy and/or constipation. Recommend increase water intake and dietary fiber. Stable for discharge home.  Assessment and Plan   1. [redacted] weeks gestation of pregnancy   2. Constipation, unspecified constipation type   3. Vaginal discharge during pregnancy in first trimester    Discharge home Follow [facility] next week as scheduled SAB precautions Rx Miralax  Allergies as of 08/06/2017   No Known Allergies     Medication List    TAKE these medications   acetaminophen 325 MG tablet Commonly known as:  TYLENOL Take 650 mg by mouth every 6 (six) hours as needed for mild pain or headache.   polyethylene glycol powder powder Commonly known as:  GLYCOLAX/MIRALAX Take 17 g by mouth daily.   prenatal multivitamin Tabs tablet Take 1 tablet by mouth daily at 12 noon.      Donette Larry, CNM 08/06/2017, 12:18 PM

## 2017-08-06 NOTE — MAU Note (Signed)
Pt reports she has had a migraine headache for several days. Taking tylenol  Without relief. Headache is gone now. Also c/o brown vaginal discharge that started yesterday.  C/o abd cramping as well. [facility] was told to come here for the discharge.

## 2017-08-11 ENCOUNTER — Other Ambulatory Visit (HOSPITAL_COMMUNITY): Payer: Self-pay | Admitting: Nurse Practitioner

## 2017-08-11 DIAGNOSIS — Z3682 Encounter for antenatal screening for nuchal translucency: Secondary | ICD-10-CM

## 2017-08-11 DIAGNOSIS — Z3A13 13 weeks gestation of pregnancy: Secondary | ICD-10-CM

## 2017-08-31 ENCOUNTER — Encounter (HOSPITAL_COMMUNITY): Payer: Self-pay | Admitting: Nurse Practitioner

## 2017-09-04 ENCOUNTER — Ambulatory Visit (HOSPITAL_COMMUNITY)
Admission: RE | Admit: 2017-09-04 | Discharge: 2017-09-04 | Disposition: A | Discharge status: Home / Self Care | Payer: BLUE CROSS/BLUE SHIELD | Source: Ambulatory Visit | Attending: Nurse Practitioner | Admitting: Nurse Practitioner

## 2017-09-04 ENCOUNTER — Encounter (HOSPITAL_COMMUNITY): Payer: Self-pay

## 2017-09-04 DIAGNOSIS — Z3682 Encounter for antenatal screening for nuchal translucency: Secondary | ICD-10-CM | POA: Diagnosis present

## 2017-09-04 DIAGNOSIS — Z3A13 13 weeks gestation of pregnancy: Secondary | ICD-10-CM | POA: Insufficient documentation

## 2017-09-14 ENCOUNTER — Other Ambulatory Visit (HOSPITAL_COMMUNITY): Payer: Self-pay

## 2018-03-16 IMAGING — US US OB TRANSVAGINAL
1 series · 15 of 28 positions shown · non-contrast
Comparison: 07/17/2017

CLINICAL DATA: Pregnancy with inconclusive viability

EXAM:
TRANSVAGINAL OB ULTRASOUND
TECHNIQUE: Transvaginal ultrasound was performed for complete evaluation of the
gestation as well as the maternal uterus, adnexal regions, and
pelvic cul-de-sac.

[Series 1: us ob transvaginal · 15 of 48 slices shown]
[im 1/48]
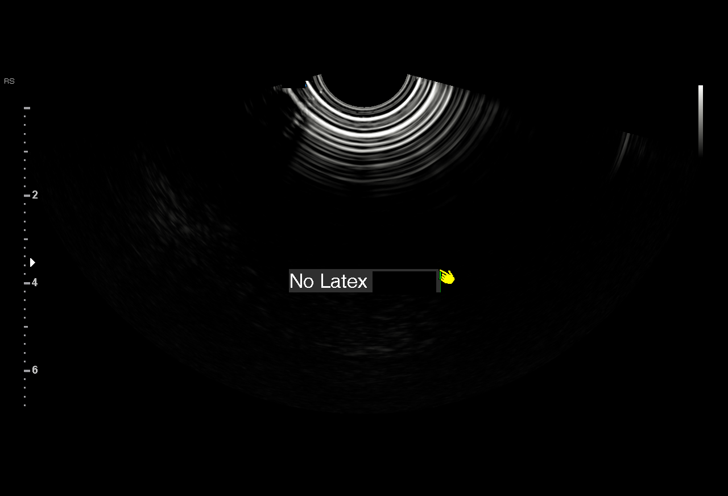
[im 4/48]
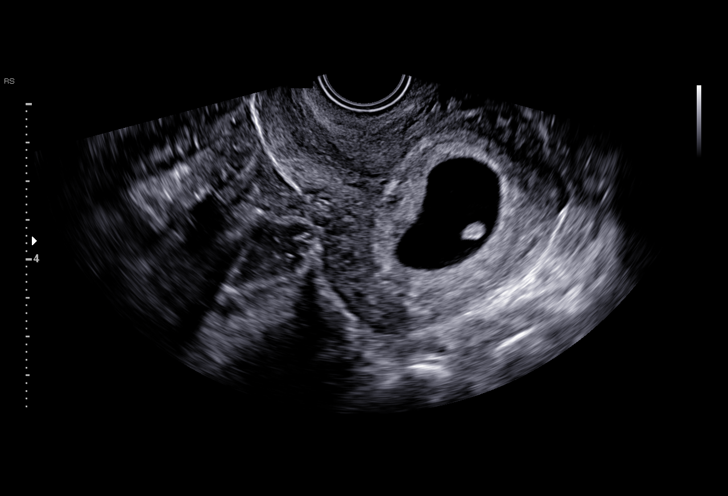
[im 7/48]
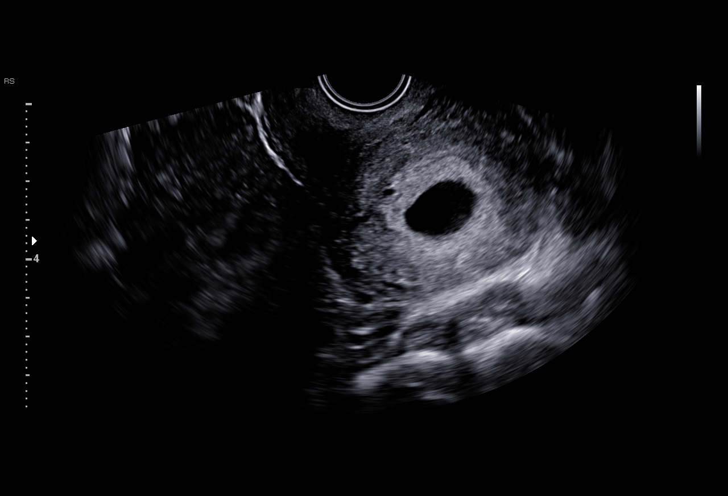
[im 11/48]
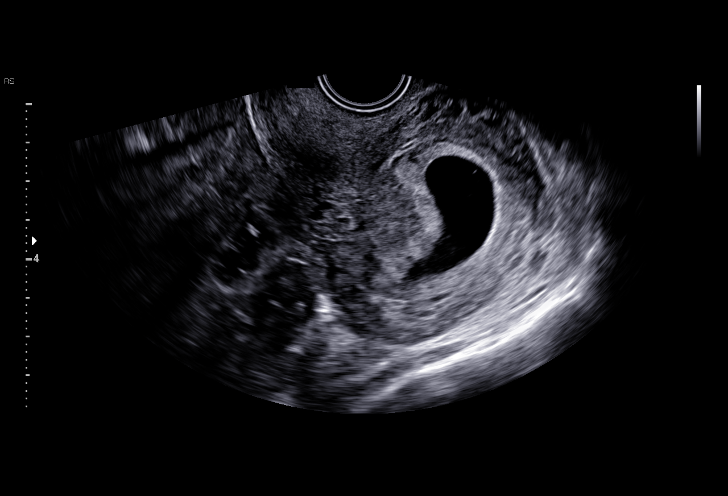
[im 14/48]
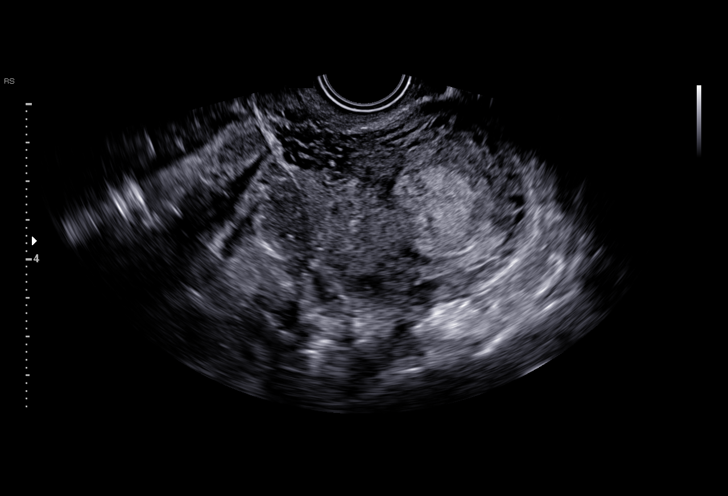
[im 18/48]
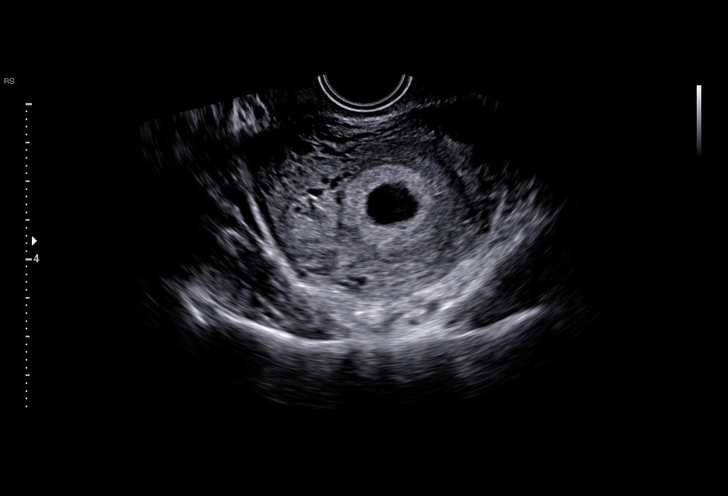
[im 21/48]
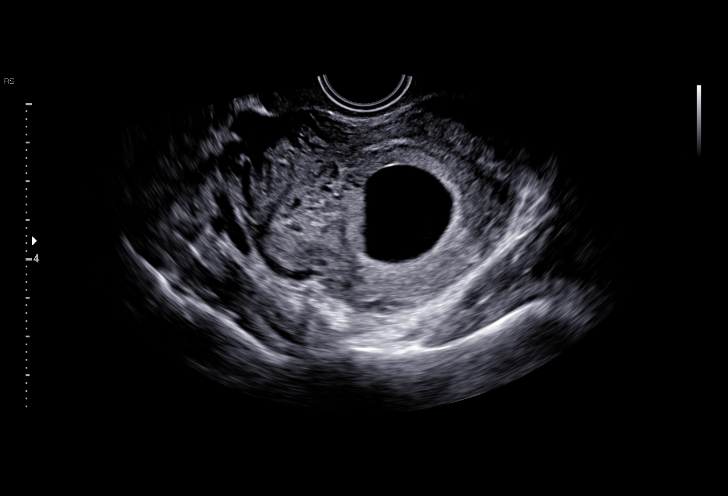
[im 25/48]
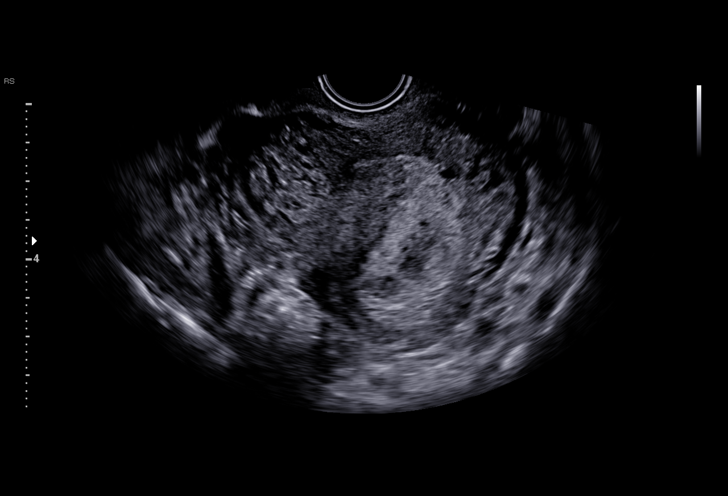
[im 27/48]
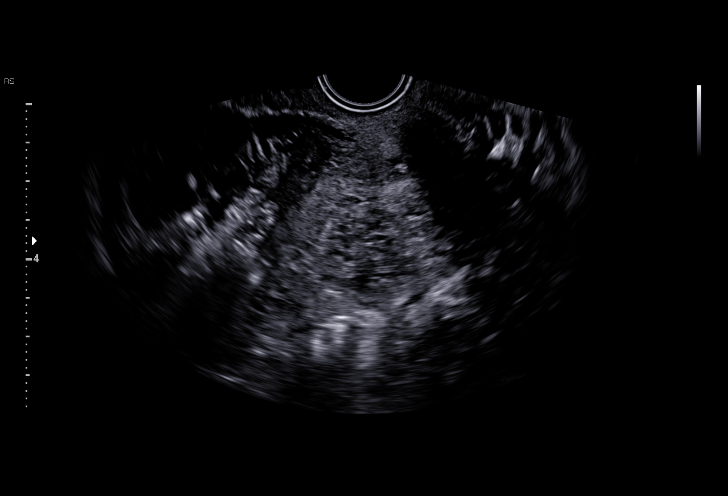
[im 30/48]
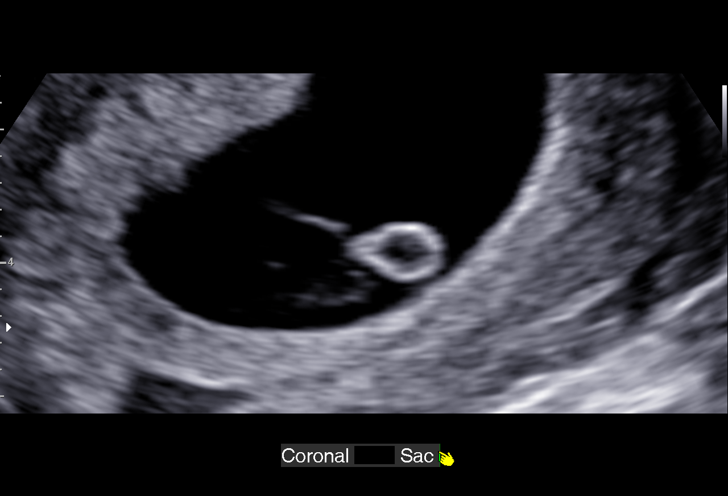
[im 34/48]
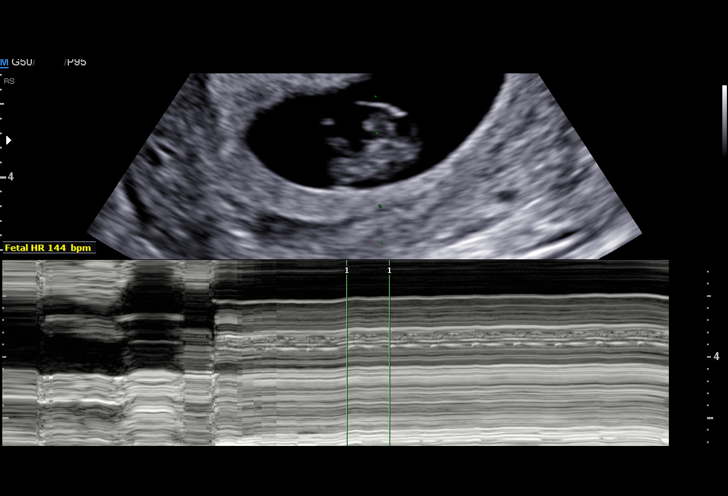
[im 37/48]
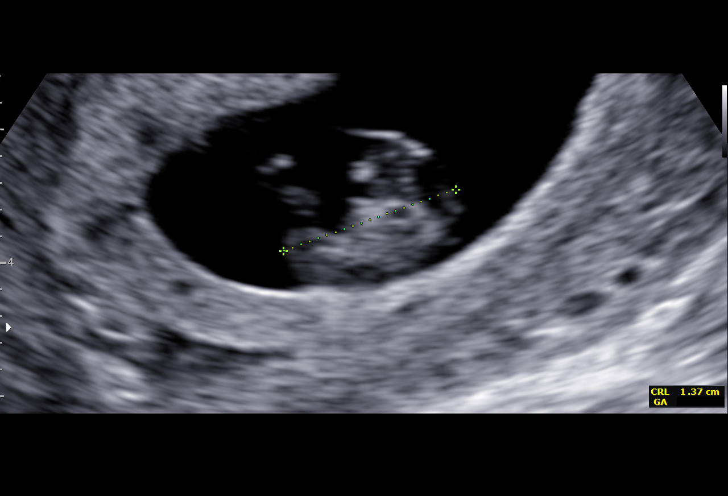
[im 41/48]
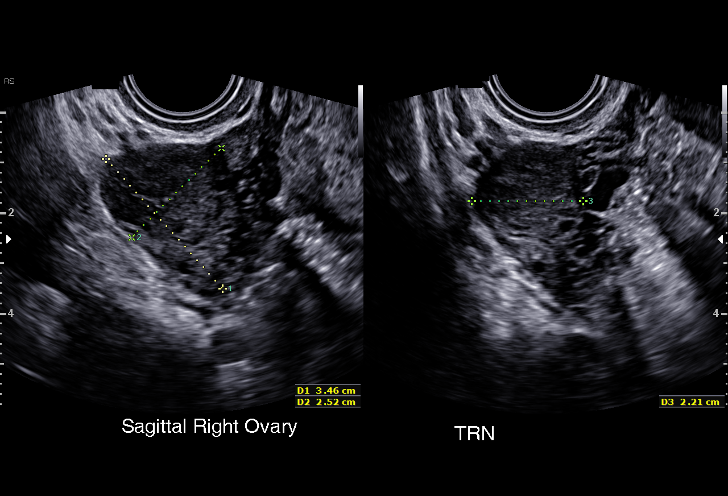
[im 44/48]
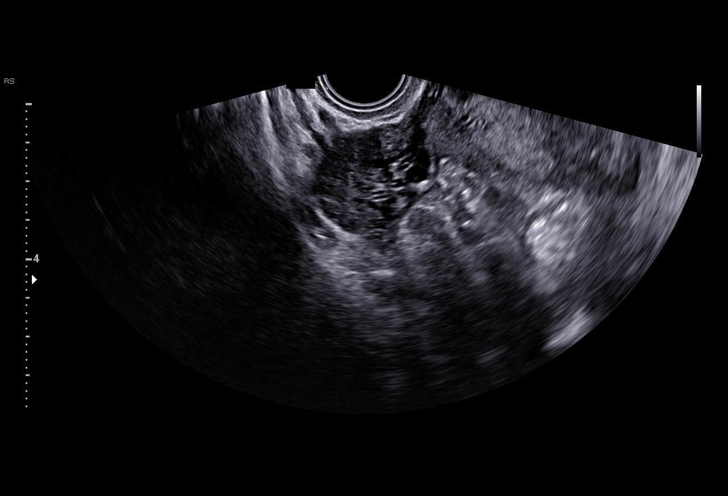
[im 48/48]
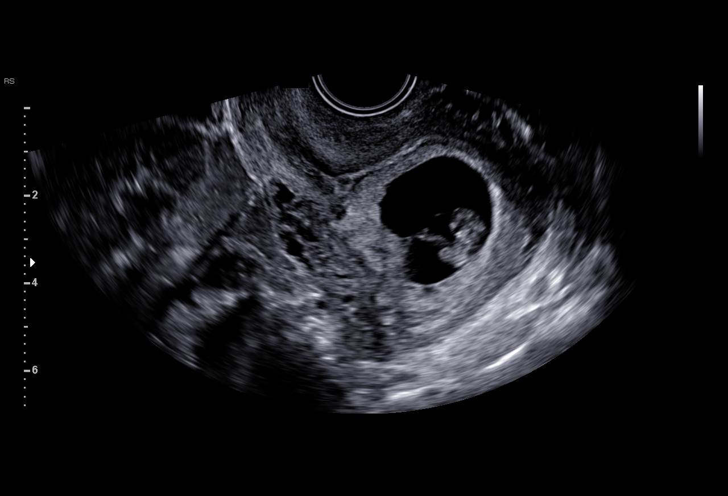

[15 of 28 positions shown; findings below may reference images not displayed]

FINDINGS: Intrauterine gestational sac: Single

Yolk sac:  Visualized

Embryo:  Visualized

Cardiac Activity: Visualized

Heart Rate: 144 bpm

CRL:   13.8 mm   7 w 4 d                  US EDC: 03/13/2018

Maternal uterus/adnexae:

Subchorionic hemorrhage: Small

Right ovary: Normal

Left ovary: Not visualized secondary to overlying bowel gas.

Other :None

Free fluid:  None
IMPRESSION: 1. Single living intrauterine gestation with an estimated
gestational age of 7 weeks and 4 days.
2. Small subchorionic hemorrhage.

## 2018-06-02 ENCOUNTER — Encounter (HOSPITAL_COMMUNITY): Payer: Self-pay

## 2019-11-09 IMAGING — US US OB TRANSVAGINAL
1 series · 13 of 28 positions shown · non-contrast
Comparison: None.

CLINICAL DATA: Abdominal cramping for 2 weeks, positive pregnancy

EXAM:
OBSTETRIC <14 WK US AND TRANSVAGINAL OB US
TECHNIQUE: Both transabdominal and transvaginal ultrasound examinations were
performed for complete evaluation of the gestation as well as the
maternal uterus, adnexal regions, and pelvic cul-de-sac.
Transvaginal technique was performed to assess early pregnancy.

[Series 1: us ob transvaginal · 0.23mm/px · 64 acquisitions, 13 frames shown]
[im 3/64]
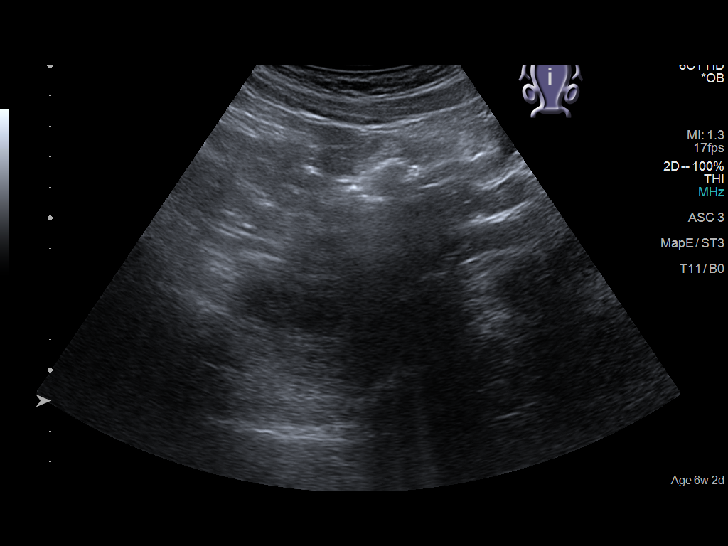
[im 8/64]
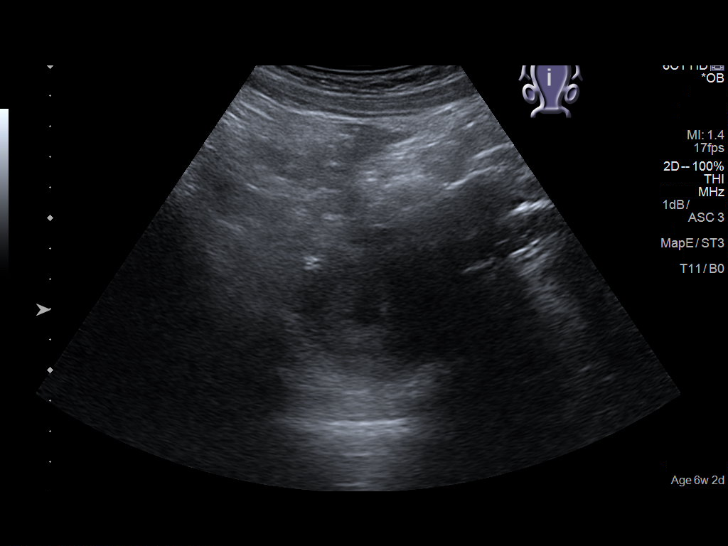
[im 12/64]
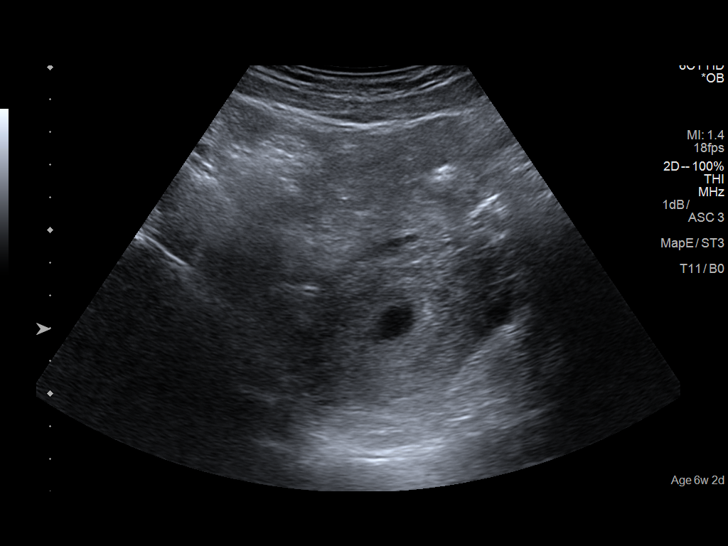
[im 17/64]
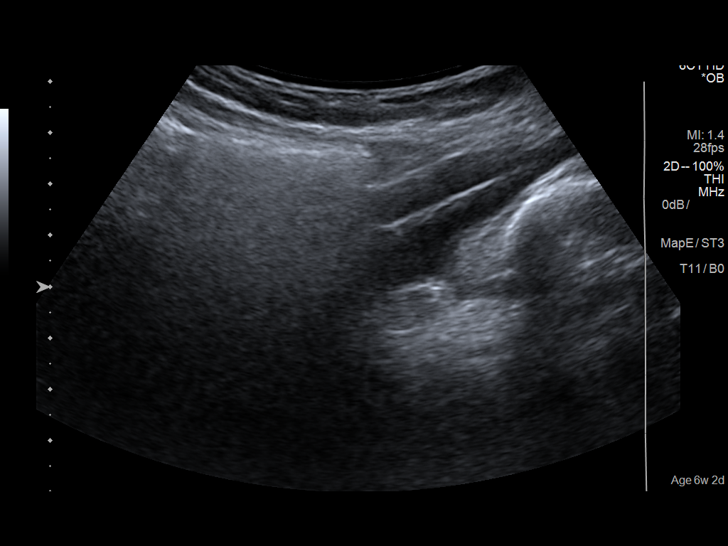
[im 22/64]
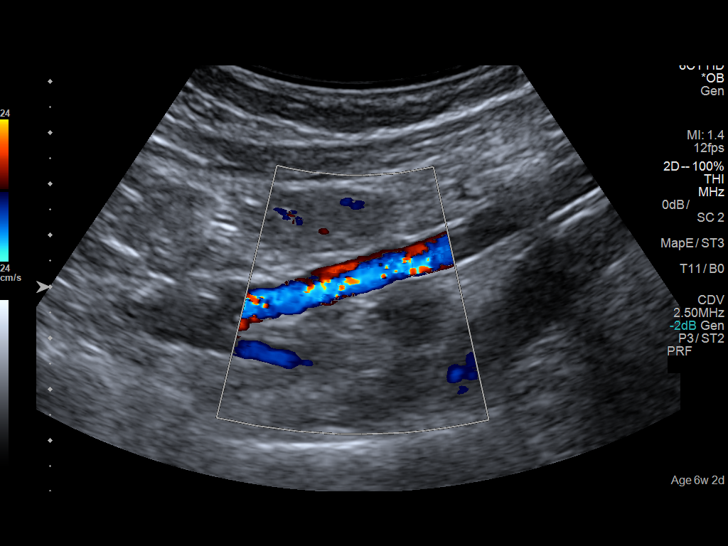
[im 26/64]
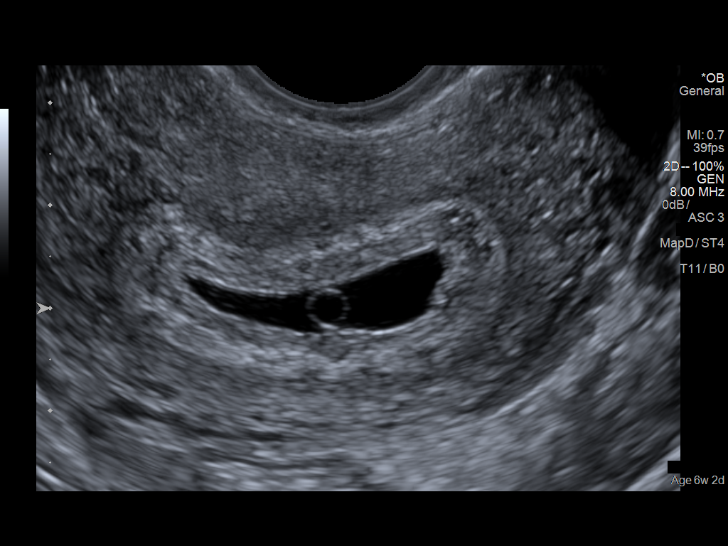
[im 33/64]
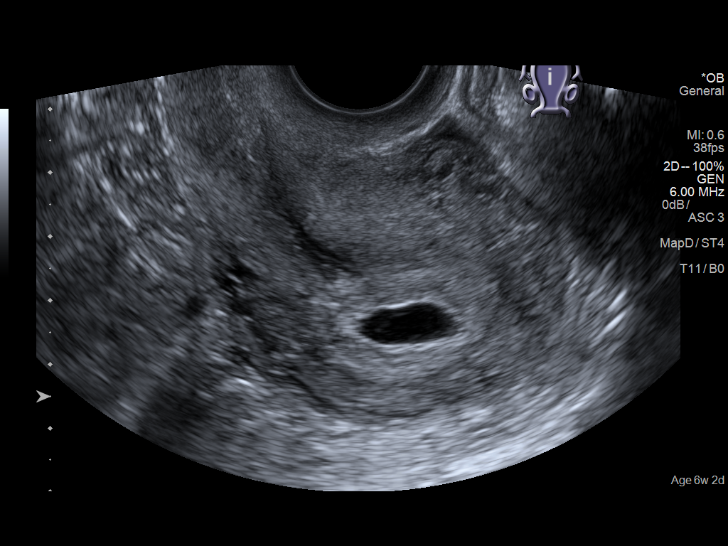
[im 38/64]
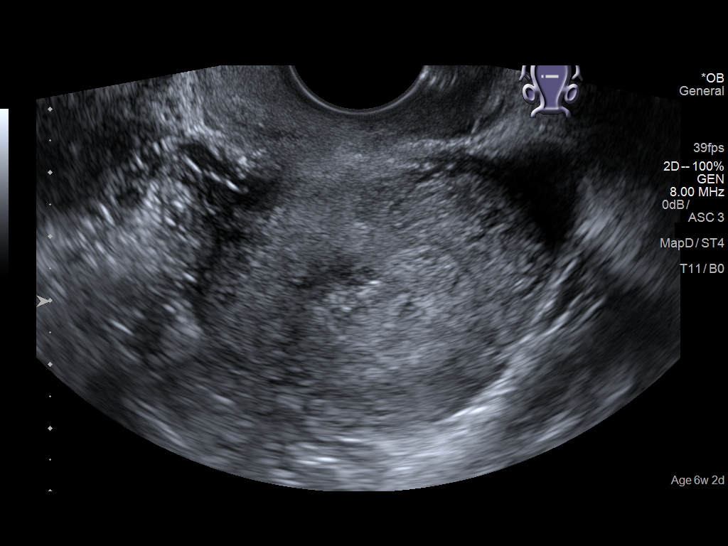
[im 43/64]
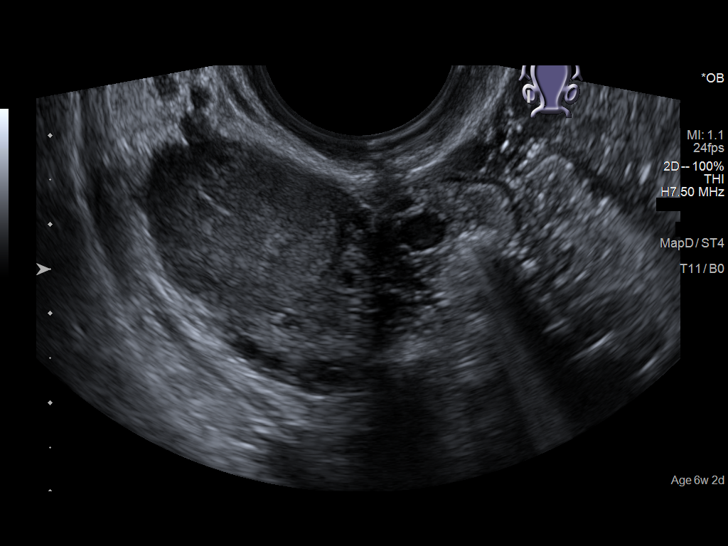
[im 47/64]
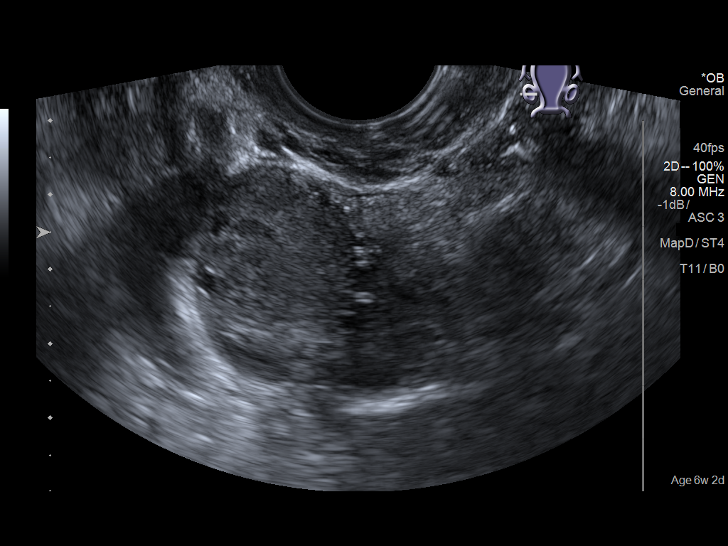
[im 52/64]
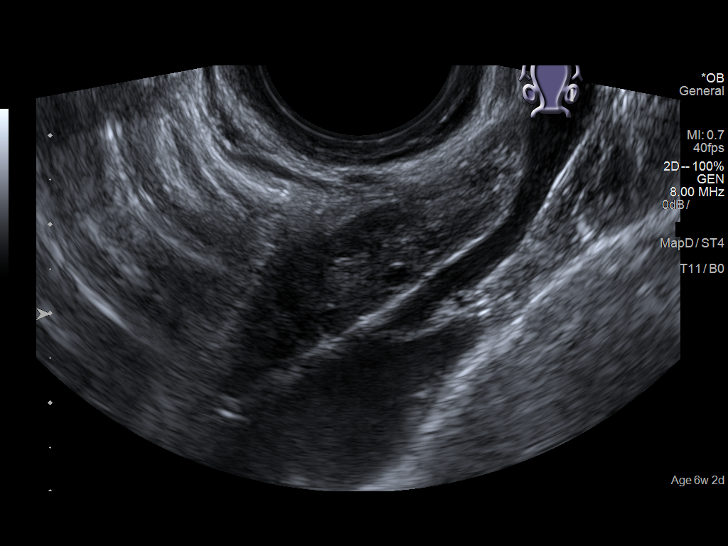
[im 57/64]
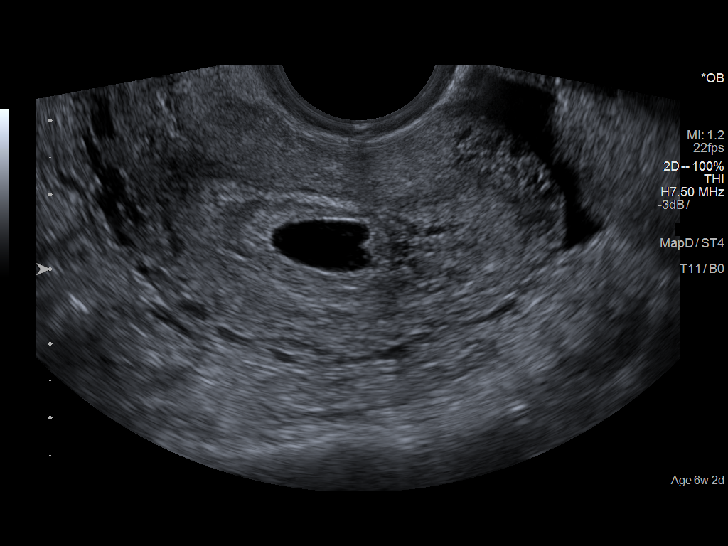
[im 61/64]
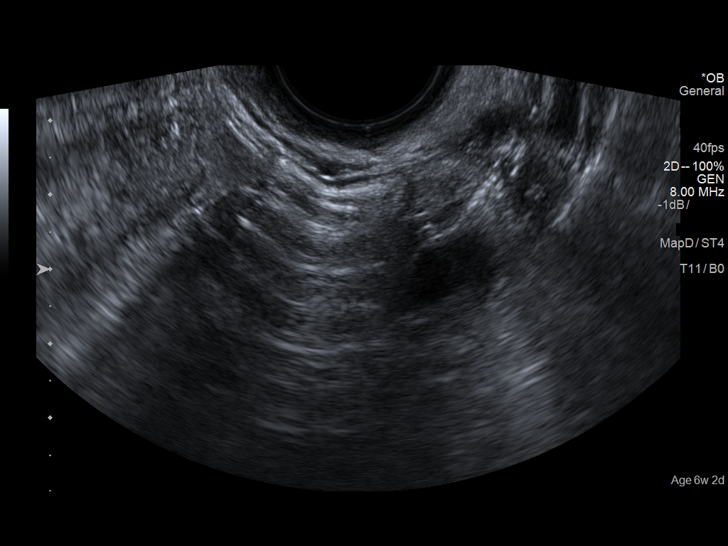

[13 of 28 positions shown; findings below may reference images not displayed]

FINDINGS: Intrauterine gestational sac: Visible

Yolk sac:  Visible

Embryo:  Not visible

MSD: 16 mm   6 w   3 d

Subchorionic hemorrhage:  None visualized.

Maternal uterus/adnexae: Left ovary is within normal limits and
measures 3.1 by 1.1 x 1.4 cm. The right ovary measures 2.4 by 3.2 x
2.4 cm. Isodose to mildly hypoechoic hypovascular area in the right
ovary measuring 2.2 x 2 x 2.1 cm. Trace free fluid.
IMPRESSION: 1. Single intrauterine pregnancy with visible gestational sac and
yolk sac but no embryo. Consider 10-14 days follow-up sonography to
confirm viability.
2. Iso to mildly hypoechoic nodule in the right ovary measuring
cm, questionable for hemorrhagic or complicated cyst. Suggest a
follow-up sonogram in 6 weeks to re-evaluate; alternatively, if
patient returns in 10-14 days to confirm viability, this finding
could also be re-evaluated at that time.
3. Trace free fluid in the pelvis
